# Patient Record
Sex: Female | Born: 1975 | Hispanic: Yes | Marital: Single | Smoking: Never smoker
Health system: Southern US, Community
[De-identification: ages and names within clinical notes are randomized; demographics above are authoritative.]

## PROBLEM LIST (undated history)

## (undated) DIAGNOSIS — J45909 Unspecified asthma, uncomplicated: Secondary | ICD-10-CM

---

## 2010-10-20 HISTORY — PX: HERNIA REPAIR: SHX51

## 2020-02-29 ENCOUNTER — Other Ambulatory Visit: Payer: Self-pay

## 2020-02-29 ENCOUNTER — Emergency Department
Admission: EM | Admit: 2020-02-29 | Discharge: 2020-03-01 | Disposition: A | Discharge status: Home / Self Care | Payer: No Typology Code available for payment source | Attending: Emergency Medicine | Admitting: Emergency Medicine

## 2020-02-29 DIAGNOSIS — S7002XA Contusion of left hip, initial encounter: Secondary | ICD-10-CM | POA: Diagnosis not present

## 2020-02-29 DIAGNOSIS — S8002XA Contusion of left knee, initial encounter: Secondary | ICD-10-CM | POA: Diagnosis not present

## 2020-02-29 DIAGNOSIS — Y92410 Unspecified street and highway as the place of occurrence of the external cause: Secondary | ICD-10-CM | POA: Insufficient documentation

## 2020-02-29 DIAGNOSIS — S7012XA Contusion of left thigh, initial encounter: Secondary | ICD-10-CM | POA: Insufficient documentation

## 2020-02-29 DIAGNOSIS — S9002XA Contusion of left ankle, initial encounter: Secondary | ICD-10-CM | POA: Insufficient documentation

## 2020-02-29 DIAGNOSIS — Y939 Activity, unspecified: Secondary | ICD-10-CM | POA: Insufficient documentation

## 2020-02-29 DIAGNOSIS — Y999 Unspecified external cause status: Secondary | ICD-10-CM | POA: Insufficient documentation

## 2020-02-29 DIAGNOSIS — J45909 Unspecified asthma, uncomplicated: Secondary | ICD-10-CM | POA: Insufficient documentation

## 2020-02-29 DIAGNOSIS — S8992XA Unspecified injury of left lower leg, initial encounter: Secondary | ICD-10-CM | POA: Diagnosis present

## 2020-02-29 HISTORY — DX: Unspecified asthma, uncomplicated: J45.909

## 2020-02-29 MED ORDER — FENTANYL CITRATE (PF) 100 MCG/2ML IJ SOLN
50.0000 ug | Freq: Once | INTRAMUSCULAR | Status: AC
  Administered 2020-02-29: 50 ug via INTRAVENOUS
  Filled 2020-02-29: qty 2
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
## 2020-03-01 ENCOUNTER — Emergency Department: Payer: No Typology Code available for payment source

## 2020-03-01 LAB — HCG, QUANTITATIVE, PREGNANCY: hCG, Beta Chain, Quant, S: <1 m[IU]/mL (ref <5)

## 2020-03-01 MED ORDER — OXYCODONE HCL 5 MG PO TABS: 5.0000 mg | ORAL_TABLET | Freq: Three times a day (TID) | ORAL | 0 refills | Status: AC | PRN

## 2020-03-01 MED ORDER — IBUPROFEN 600 MG PO TABS
600.0000 mg | ORAL_TABLET | Freq: Once | ORAL | Status: AC
  Administered 2020-03-01: 600 mg via ORAL
  Filled 2020-03-01: qty 1

## 2020-03-01 MED ORDER — IBUPROFEN 800 MG PO TABS
800.0000 mg | ORAL_TABLET | Freq: Three times a day (TID) | ORAL | 0 refills | Status: AC | PRN
Start: 2020-03-01 — End: ?

## 2020-03-01 MED ORDER — OXYCODONE HCL 5 MG PO TABS
5.0000 mg | ORAL_TABLET | Freq: Once | ORAL | Status: AC
  Administered 2020-03-01: 5 mg via ORAL
  Filled 2020-03-01: qty 1
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
